# Patient Record
Sex: Male | Born: 1950 | Hispanic: No | Smoking: Former smoker
Health system: Southern US, Community
[De-identification: ages and names within clinical notes are randomized; demographics above are authoritative.]

---

## 2015-03-13 ENCOUNTER — Emergency Department (HOSPITAL_COMMUNITY)

## 2015-03-13 ENCOUNTER — Encounter (HOSPITAL_COMMUNITY): Payer: Self-pay | Admitting: Emergency Medicine

## 2015-03-13 ENCOUNTER — Encounter (HOSPITAL_COMMUNITY)
Admission: EM | Disposition: A | Discharge status: Home / Self Care | Payer: Self-pay | Source: Home / Self Care | Attending: Emergency Medicine

## 2015-03-13 ENCOUNTER — Emergency Department (HOSPITAL_COMMUNITY): Admitting: Anesthesiology

## 2015-03-13 ENCOUNTER — Emergency Department (HOSPITAL_COMMUNITY)
Admission: EM | Admit: 2015-03-13 | Discharge: 2015-03-14 | Disposition: A | Discharge status: Home / Self Care | Attending: Orthopedic Surgery | Admitting: Orthopedic Surgery

## 2015-03-13 DIAGNOSIS — W298XXA Contact with other powered powered hand tools and household machinery, initial encounter: Secondary | ICD-10-CM | POA: Insufficient documentation

## 2015-03-13 DIAGNOSIS — S66325A Laceration of extensor muscle, fascia and tendon of left ring finger at wrist and hand level, initial encounter: Secondary | ICD-10-CM | POA: Insufficient documentation

## 2015-03-13 DIAGNOSIS — Y9389 Activity, other specified: Secondary | ICD-10-CM | POA: Insufficient documentation

## 2015-03-13 DIAGNOSIS — S62325B Displaced fracture of shaft of fourth metacarpal bone, left hand, initial encounter for open fracture: Secondary | ICD-10-CM | POA: Insufficient documentation

## 2015-03-13 DIAGNOSIS — S66323A Laceration of extensor muscle, fascia and tendon of left middle finger at wrist and hand level, initial encounter: Secondary | ICD-10-CM | POA: Diagnosis not present

## 2015-03-13 DIAGNOSIS — Z23 Encounter for immunization: Secondary | ICD-10-CM | POA: Insufficient documentation

## 2015-03-13 DIAGNOSIS — F17210 Nicotine dependence, cigarettes, uncomplicated: Secondary | ICD-10-CM | POA: Diagnosis not present

## 2015-03-13 DIAGNOSIS — Y92009 Unspecified place in unspecified non-institutional (private) residence as the place of occurrence of the external cause: Secondary | ICD-10-CM | POA: Diagnosis not present

## 2015-03-13 DIAGNOSIS — S61412A Laceration without foreign body of left hand, initial encounter: Secondary | ICD-10-CM

## 2015-03-13 DIAGNOSIS — S62309B Unspecified fracture of unspecified metacarpal bone, initial encounter for open fracture: Secondary | ICD-10-CM

## 2015-03-13 DIAGNOSIS — W293XXA Contact with powered garden and outdoor hand tools and machinery, initial encounter: Secondary | ICD-10-CM | POA: Diagnosis not present

## 2015-03-13 DIAGNOSIS — Y999 Unspecified external cause status: Secondary | ICD-10-CM | POA: Diagnosis not present

## 2015-03-13 DIAGNOSIS — S62323B Displaced fracture of shaft of third metacarpal bone, left hand, initial encounter for open fracture: Secondary | ICD-10-CM | POA: Insufficient documentation

## 2015-03-13 DIAGNOSIS — S62301A Unspecified fracture of second metacarpal bone, left hand, initial encounter for closed fracture: Secondary | ICD-10-CM | POA: Diagnosis not present

## 2015-03-13 HISTORY — PX: I&D EXTREMITY: SHX5045

## 2015-03-13 HISTORY — PX: OPEN REDUCTION INTERNAL FIXATION (ORIF) HAND: SHX5991

## 2015-03-13 SURGERY — MINOR IRRIGATION AND DEBRIDEMENT EXTREMITY
Anesthesia: General | Site: Hand | Laterality: Left

## 2015-03-13 MED ORDER — DEXAMETHASONE SODIUM PHOSPHATE 4 MG/ML IJ SOLN
INTRAMUSCULAR | Status: DC | PRN
  Administered 2015-03-13: 4 mg via INTRAVENOUS

## 2015-03-13 MED ORDER — CEFAZOLIN SODIUM 1-5 GM-% IV SOLN: 2.0000 g | Freq: Once | INTRAVENOUS | Status: DC

## 2015-03-13 MED ORDER — CEFAZOLIN SODIUM-DEXTROSE 2-3 GM-% IV SOLR
INTRAVENOUS | Status: DC | PRN
Start: 2015-03-13 — End: 2015-03-14
  Administered 2015-03-13: 2 g via INTRAVENOUS

## 2015-03-13 MED ORDER — ONDANSETRON HCL 4 MG/2ML IJ SOLN
INTRAMUSCULAR | Status: AC
  Filled 2015-03-13: qty 2

## 2015-03-13 MED ORDER — SODIUM CHLORIDE 0.9 % IR SOLN
Status: DC | PRN
  Administered 2015-03-13: 1000 mL

## 2015-03-13 MED ORDER — BUPIVACAINE HCL (PF) 0.25 % IJ SOLN
INTRAMUSCULAR | Status: AC
  Filled 2015-03-13: qty 30

## 2015-03-13 MED ORDER — ONDANSETRON HCL 4 MG/2ML IJ SOLN
4.0000 mg | Freq: Once | INTRAMUSCULAR | Status: AC
  Administered 2015-03-13: 4 mg via INTRAVENOUS
  Filled 2015-03-13: qty 2

## 2015-03-13 MED ORDER — CEFAZOLIN SODIUM-DEXTROSE 2-3 GM-% IV SOLR
2.0000 g | Freq: Once | INTRAVENOUS | Status: AC
  Administered 2015-03-13: 2 g via INTRAVENOUS
  Filled 2015-03-13: qty 50

## 2015-03-13 MED ORDER — MORPHINE SULFATE (PF) 4 MG/ML IV SOLN
4.0000 mg | Freq: Once | INTRAVENOUS | Status: AC
Start: 2015-03-13 — End: 2015-03-13
  Administered 2015-03-13: 4 mg via INTRAVENOUS
  Filled 2015-03-13: qty 1

## 2015-03-13 MED ORDER — SUFENTANIL CITRATE 50 MCG/ML IV SOLN
INTRAVENOUS | Status: AC
  Filled 2015-03-13: qty 1

## 2015-03-13 MED ORDER — MIDAZOLAM HCL 5 MG/5ML IJ SOLN
INTRAMUSCULAR | Status: DC | PRN
  Administered 2015-03-13: 2 mg via INTRAVENOUS

## 2015-03-13 MED ORDER — PROPOFOL 10 MG/ML IV BOLUS
INTRAVENOUS | Status: DC | PRN
  Administered 2015-03-13: 150 mg via INTRAVENOUS

## 2015-03-13 MED ORDER — STERILE WATER FOR INJECTION IJ SOLN
INTRAMUSCULAR | Status: AC
  Filled 2015-03-13: qty 20

## 2015-03-13 MED ORDER — ONDANSETRON HCL 4 MG/2ML IJ SOLN
4.0000 mg | Freq: Once | INTRAMUSCULAR | Status: DC
  Filled 2015-03-13: qty 2

## 2015-03-13 MED ORDER — LIDOCAINE HCL (CARDIAC) 20 MG/ML IV SOLN
INTRAVENOUS | Status: AC
  Filled 2015-03-13: qty 5

## 2015-03-13 MED ORDER — PROPOFOL 10 MG/ML IV BOLUS
INTRAVENOUS | Status: AC
  Filled 2015-03-13: qty 20

## 2015-03-13 MED ORDER — LACTATED RINGERS IV SOLN
INTRAVENOUS | Status: DC | PRN
  Administered 2015-03-13: 23:00:00 via INTRAVENOUS

## 2015-03-13 MED ORDER — CEFAZOLIN SODIUM 1-5 GM-% IV SOLN
INTRAVENOUS | Status: AC
  Filled 2015-03-13: qty 50

## 2015-03-13 MED ORDER — CEFAZOLIN SODIUM 1 G IJ SOLR
2.0000 g | Freq: Once | INTRAMUSCULAR | Status: DC
  Filled 2015-03-13: qty 20

## 2015-03-13 MED ORDER — MORPHINE SULFATE (PF) 4 MG/ML IV SOLN
4.0000 mg | Freq: Once | INTRAVENOUS | Status: DC
  Filled 2015-03-13: qty 1

## 2015-03-13 MED ORDER — MORPHINE SULFATE (PF) 4 MG/ML IV SOLN
4.0000 mg | Freq: Once | INTRAVENOUS | Status: AC
  Administered 2015-03-13: 4 mg via INTRAVENOUS
  Filled 2015-03-13: qty 1

## 2015-03-13 MED ORDER — LIDOCAINE HCL (PF) 1 % IJ SOLN
INTRAMUSCULAR | Status: AC
  Filled 2015-03-13: qty 5

## 2015-03-13 MED ORDER — CEFAZOLIN SODIUM-DEXTROSE 2-3 GM-% IV SOLR
INTRAVENOUS | Status: AC
  Filled 2015-03-13: qty 50

## 2015-03-13 MED ORDER — TETANUS-DIPHTH-ACELL PERTUSSIS 5-2.5-18.5 LF-MCG/0.5 IM SUSP
0.5000 mL | Freq: Once | INTRAMUSCULAR | Status: AC
  Administered 2015-03-13: 0.5 mL via INTRAMUSCULAR
  Filled 2015-03-13: qty 0.5

## 2015-03-13 MED ORDER — OXYCODONE-ACETAMINOPHEN 5-325 MG PO TABS
1.0000 | ORAL_TABLET | Freq: Once | ORAL | Status: DC
Start: 2015-03-13 — End: 2015-03-13

## 2015-03-13 MED ORDER — MIDAZOLAM HCL 2 MG/2ML IJ SOLN
INTRAMUSCULAR | Status: AC
  Filled 2015-03-13: qty 4

## 2015-03-13 MED ORDER — BACITRACIN ZINC 500 UNIT/GM EX OINT
TOPICAL_OINTMENT | CUTANEOUS | Status: AC
  Filled 2015-03-13: qty 28.35

## 2015-03-13 MED ORDER — LIDOCAINE HCL (CARDIAC) 20 MG/ML IV SOLN
INTRAVENOUS | Status: DC | PRN
  Administered 2015-03-13: 60 mg via INTRAVENOUS

## 2015-03-13 MED ORDER — DEXAMETHASONE SODIUM PHOSPHATE 4 MG/ML IJ SOLN
INTRAMUSCULAR | Status: AC
  Filled 2015-03-13: qty 1

## 2015-03-13 MED ORDER — SUFENTANIL CITRATE 50 MCG/ML IV SOLN
INTRAVENOUS | Status: DC | PRN
  Administered 2015-03-13 (×2): 5 ug via INTRAVENOUS

## 2015-03-13 SURGICAL SUPPLY — 80 items
BANDAGE COBAN STERILE 2 (GAUZE/BANDAGES/DRESSINGS) IMPLANT
BANDAGE ELASTIC 3 VELCRO ST LF (GAUZE/BANDAGES/DRESSINGS) ×4 IMPLANT
BANDAGE ELASTIC 4 VELCRO ST LF (GAUZE/BANDAGES/DRESSINGS) ×4 IMPLANT
BLADE MINI RND TIP GREEN BEAV (BLADE) IMPLANT
BNDG COHESIVE 1X5 TAN STRL LF (GAUZE/BANDAGES/DRESSINGS) IMPLANT
BNDG COHESIVE 3X5 TAN STRL LF (GAUZE/BANDAGES/DRESSINGS) IMPLANT
BNDG CONFORM 2 STRL LF (GAUZE/BANDAGES/DRESSINGS) IMPLANT
BNDG ESMARK 4X9 LF (GAUZE/BANDAGES/DRESSINGS) ×4 IMPLANT
BNDG GAUZE ELAST 4 BULKY (GAUZE/BANDAGES/DRESSINGS) ×4 IMPLANT
CORDS BIPOLAR (ELECTRODE) ×4 IMPLANT
COVER SURGICAL LIGHT HANDLE (MISCELLANEOUS) ×4 IMPLANT
CUFF TOURNIQUET SINGLE 18IN (TOURNIQUET CUFF) ×4 IMPLANT
CUFF TOURNIQUET SINGLE 24IN (TOURNIQUET CUFF) IMPLANT
DECANTER SPIKE VIAL GLASS SM (MISCELLANEOUS) ×4 IMPLANT
DRAIN PENROSE 1/4X12 LTX STRL (WOUND CARE) IMPLANT
DRAPE OEC MINIVIEW 54X84 (DRAPES) IMPLANT
DRSG ADAPTIC 3X8 NADH LF (GAUZE/BANDAGES/DRESSINGS) ×4 IMPLANT
DRSG EMULSION OIL 3X3 NADH (GAUZE/BANDAGES/DRESSINGS) ×4 IMPLANT
DRSG KUZMA FLUFF (GAUZE/BANDAGES/DRESSINGS) IMPLANT
DRSG PAD ABDOMINAL 8X10 ST (GAUZE/BANDAGES/DRESSINGS) ×8 IMPLANT
DURAPREP 26ML APPLICATOR (WOUND CARE) IMPLANT
GAUZE SPONGE 2X2 8PLY STRL LF (GAUZE/BANDAGES/DRESSINGS) IMPLANT
GAUZE SPONGE 4X4 12PLY STRL (GAUZE/BANDAGES/DRESSINGS) ×4 IMPLANT
GAUZE XEROFORM 1X8 LF (GAUZE/BANDAGES/DRESSINGS) ×4 IMPLANT
GAUZE XEROFORM 5X9 LF (GAUZE/BANDAGES/DRESSINGS) ×4 IMPLANT
GLOVE BIO SURGEON STRL SZ7 (GLOVE) ×8 IMPLANT
GLOVE BIO SURGEON STRL SZ7.5 (GLOVE) ×4 IMPLANT
GLOVE BIOGEL PI IND STRL 7.0 (GLOVE) ×4 IMPLANT
GLOVE BIOGEL PI IND STRL 8 (GLOVE) ×2 IMPLANT
GLOVE BIOGEL PI INDICATOR 7.0 (GLOVE) ×4
GLOVE BIOGEL PI INDICATOR 8 (GLOVE) ×2
GOWN STRL REUS W/ TWL LRG LVL3 (GOWN DISPOSABLE) ×6 IMPLANT
GOWN STRL REUS W/ TWL XL LVL3 (GOWN DISPOSABLE) ×2 IMPLANT
GOWN STRL REUS W/TWL LRG LVL3 (GOWN DISPOSABLE) ×6
GOWN STRL REUS W/TWL XL LVL3 (GOWN DISPOSABLE) ×2
HANDPIECE INTERPULSE COAX TIP (DISPOSABLE)
K-WIRE DBL 4X.28 (WIRE) ×16
KIT BASIN OR (CUSTOM PROCEDURE TRAY) ×4 IMPLANT
KIT ROOM TURNOVER OR (KITS) ×4 IMPLANT
KWIRE DBL 4X.28 (WIRE) ×8 IMPLANT
LOOP VESSEL MAXI BLUE (MISCELLANEOUS) ×4 IMPLANT
LOOP VESSEL MINI RED (MISCELLANEOUS) IMPLANT
MANIFOLD NEPTUNE II (INSTRUMENTS) ×4 IMPLANT
NEEDLE HYPO 25GX1X1/2 BEV (NEEDLE) ×4 IMPLANT
NEEDLE HYPO 25X1 1.5 SAFETY (NEEDLE) IMPLANT
NS IRRIG 1000ML POUR BTL (IV SOLUTION) ×4 IMPLANT
PACK ORTHO EXTREMITY (CUSTOM PROCEDURE TRAY) ×4 IMPLANT
PAD ARMBOARD 7.5X6 YLW CONV (MISCELLANEOUS) ×8 IMPLANT
PAD CAST 4YDX4 CTTN HI CHSV (CAST SUPPLIES) IMPLANT
PADDING CAST ABS 4INX4YD NS (CAST SUPPLIES) ×2
PADDING CAST ABS COTTON 4X4 ST (CAST SUPPLIES) ×2 IMPLANT
PADDING CAST COTTON 4X4 STRL (CAST SUPPLIES)
RUBBERBAND STERILE (MISCELLANEOUS) ×4 IMPLANT
SCRUB BETADINE 4OZ XXX (MISCELLANEOUS) ×4 IMPLANT
SET CYSTO W/LG BORE CLAMP LF (SET/KITS/TRAYS/PACK) ×4 IMPLANT
SET HNDPC FAN SPRY TIP SCT (DISPOSABLE) IMPLANT
SOLUTION BETADINE 4OZ (MISCELLANEOUS) ×4 IMPLANT
SPECIMEN JAR SMALL (MISCELLANEOUS) ×4 IMPLANT
SPLINT FIBERGLASS 3X12 (CAST SUPPLIES) ×4 IMPLANT
SPONGE GAUZE 2X2 STER 10/PKG (GAUZE/BANDAGES/DRESSINGS)
SPONGE GAUZE 4X4 12PLY STER LF (GAUZE/BANDAGES/DRESSINGS) ×4 IMPLANT
SPONGE LAP 18X18 X RAY DECT (DISPOSABLE) ×4 IMPLANT
SPONGE LAP 4X18 X RAY DECT (DISPOSABLE) ×4 IMPLANT
SPONGE SCRUB IODOPHOR (GAUZE/BANDAGES/DRESSINGS) ×4 IMPLANT
SUCTION FRAZIER TIP 10 FR DISP (SUCTIONS) ×4 IMPLANT
SUT ETHILON 4 0 PS 2 18 (SUTURE) ×8 IMPLANT
SUT ETHILON 5 0 PS 2 18 (SUTURE) IMPLANT
SUT MON AB 5-0 P3 18 (SUTURE) IMPLANT
SUT SILK 4 0 PS 2 (SUTURE) IMPLANT
SUT VICRYL 4-0 PS2 18IN ABS (SUTURE) ×12 IMPLANT
SYR CONTROL 10ML LL (SYRINGE) IMPLANT
TOWEL OR 17X24 6PK STRL BLUE (TOWEL DISPOSABLE) ×4 IMPLANT
TOWEL OR 17X26 10 PK STRL BLUE (TOWEL DISPOSABLE) ×4 IMPLANT
TUBE ANAEROBIC SPECIMEN COL (MISCELLANEOUS) IMPLANT
TUBE CONNECTING 12'X1/4 (SUCTIONS) ×1
TUBE CONNECTING 12X1/4 (SUCTIONS) ×3 IMPLANT
TUBE FEEDING 5FR 15 INCH (TUBING) IMPLANT
UNDERPAD 30X30 INCONTINENT (UNDERPADS AND DIAPERS) ×4 IMPLANT
WATER STERILE IRR 1000ML POUR (IV SOLUTION) ×4 IMPLANT
YANKAUER SUCT BULB TIP NO VENT (SUCTIONS) ×4 IMPLANT

## 2015-03-13 NOTE — ED Notes (Signed)
Pt states that he cut hit hand with an angle grinder.  States blade came apart and sliced left hand.

## 2015-03-13 NOTE — H&P (Signed)
  Adam Gentry is an 64 y.o. male.   Chief Complaint: left hand grinder injury HPI: 64 yo male states he was using a grinder earlier today when the wheel broke and lacerated his left hand.  Seen at [facility] where XR revealed metacarpal fractures.  Transferred to [facility] for further care.  Reports no other injury at this time.  History reviewed. No pertinent past medical history.  History reviewed. No pertinent past surgical history.  History reviewed. No pertinent family history. Social History:  reports that he has been smoking Cigarettes.  He does not have any smokeless tobacco history on file. He reports that he drinks alcohol. He reports that he does not use illicit drugs.  Allergies: No Known Allergies   (Not in a hospital admission)  No results found for this or any previous visit (from the past 48 hour(s)).  Dg Hand Complete Left  03/13/2015   CLINICAL DATA:  Status post laceration of the left hand when a grinder broke apart today. Initial encounter.  EXAM: LEFT HAND - COMPLETE 3+ VIEW  COMPARISON:  None.  FINDINGS: A laceration is seen over the dorsum of the hand centered over the third, fourth and fifth MCP joints. There is a large amount of radiopaque debris within the laceration in the webspace between the long and ring and ring and little fingers and at the level of the distal second and third metacarpals.  The patient has a nondisplaced fracture of the ulnar margin of the head of the third metacarpal. It does not appear to reach the articular surface. There is also a fracture of the fourth metacarpal which is oblique in orientation extending from the mid diaphysis on the ulnar side through the metaphysis on the radial side. The fracture appears to have a nondisplaced component extending to the articular surface of the fourth metacarpal centrally.  A small piece of radiopaque debris is seen in the little finger and is likely chronic. Radiocarpal degenerative disease is noted.  IMPRESSION:  Laceration of the dorsum of the hand with radiopaque debris and fractures of the third and fourth metacarpals.   Electronically Signed   By: Drusilla Kanner M.D.   On: 03/13/2015 15:24     A comprehensive review of systems was negative.  Blood pressure 159/92, pulse 83, temperature 98.5 F (36.9 C), temperature source Oral, resp. rate 16, height 6' (1.829 m), weight 77.111 kg (170 lb), SpO2 98 %.  General appearance: alert, cooperative and appears stated age Head: Normocephalic, without obvious abnormality, atraumatic Neck: supple, symmetrical, trachea midline Resp: clear to auscultation bilaterally Cardio: regular rate and rhythm GI: non tender Extremities: intact senation and capillary refill all digits.  +epl/fpl/io.  weak extension of long and ring fingers.  laceration dorsal hand over mp of long and ring. Pulses: 2+ and symmetric Skin: Skin color, texture, turgor normal. No rashes or lesions Neurologic: Grossly normal Incision/Wound: As above  Assessment/Plan Left hand long and ring open metacarpal fractures and extensor tendon lacerations.  Recommend OR for irrigation and debridement and fixation of fractures and repair tendon.  Risks, benefits, and alternatives of surgery were discussed and the patient agrees with the plan of care.   KUZMA,KEVIN R 03/13/2015, 10:45 PM

## 2015-03-13 NOTE — ED Notes (Signed)
Report to Chad, Charity fundraiser Engineer, [employer] systems) at [facility] ED

## 2015-03-13 NOTE — ED Notes (Signed)
Report given to [facility] Elita Quick, RN) at this time. ETA .

## 2015-03-13 NOTE — Anesthesia Preprocedure Evaluation (Addendum)
Anesthesia Evaluation  Patient identified by MRN, date of birth, ID band Patient awake    Reviewed: Allergy & Precautions, NPO status , Patient's Chart, lab work & pertinent test results  Airway Mallampati: II  TM Distance: >3 FB Neck ROM: Full    Dental   Pulmonary Current Smoker,    breath sounds clear to auscultation       Cardiovascular negative cardio ROS   Rhythm:Regular Rate:Normal     Neuro/Psych negative neurological ROS     GI/Hepatic negative GI ROS, Neg liver ROS,   Endo/Other  negative endocrine ROS  Renal/GU negative Renal ROS     Musculoskeletal   Abdominal   Peds  Hematology negative hematology ROS (+)   Anesthesia Other Findings   Reproductive/Obstetrics                           Anesthesia Physical Anesthesia Plan  ASA: II  Anesthesia Plan: General   Post-op Pain Management:    Induction: Intravenous  Airway Management Planned: LMA  Additional Equipment:   Intra-op Plan:   Post-operative Plan:   Informed Consent: I have reviewed the patients History and Physical, chart, labs and discussed the procedure including the risks, benefits and alternatives for the proposed anesthesia with the patient or authorized representative who has indicated his/her understanding and acceptance.     Plan Discussed with: CRNA  Anesthesia Plan Comments:        Anesthesia Quick Evaluation

## 2015-03-13 NOTE — Anesthesia Procedure Notes (Signed)
Procedure Name: LMA Insertion Date/Time: 03/13/2015 11:07 PM Performed by: Melina Schools Pre-anesthesia Checklist: Patient identified, Emergency Drugs available, Suction available, Patient being monitored and Timeout performed Patient Re-evaluated:Patient Re-evaluated prior to inductionOxygen Delivery Method: Circle system utilized Preoxygenation: Pre-oxygenation with 100% oxygen Intubation Type: IV induction LMA: LMA inserted LMA Size: 4.0 Number of attempts: 1 Placement Confirmation: positive ETCO2 and breath sounds checked- equal and bilateral Tube secured with: Tape Dental Injury: Teeth and Oropharynx as per pre-operative assessment

## 2015-03-13 NOTE — ED Provider Notes (Signed)
CSN: [id number]     Arrival date & time 03/13/15  1417 History   First MD Initiated Contact with Patient 03/13/15 1455     Chief Complaint  Patient presents with  . Hand Injury    left     (Consider location/radiation/quality/duration/timing/severity/associated sxs/prior Treatment) HPI Comments: 64 year old right-handed male who presents with left hand laceration. Just prior to arrival, the patient was using an angle grinder when the blade came apart and sliced the dorsum of his left hand. It bumped his left thigh but was blocked by his cell phone and he did not sustain any other injuries. He endorses normal sensation of his fingers and feels like he can move his fingers normally. He endorses severe, constant pain in his hand, worse with movement. Unknown last tetanus vaccination.  Patient is a 64 y.o. male presenting with hand injury. The history is provided by the patient.  Hand Injury   History reviewed. No pertinent past medical history. History reviewed. No pertinent past surgical history. History reviewed. No pertinent family history. Social History  Substance Use Topics  . Smoking status: Current Some Day Smoker    Types: Cigarettes  . Smokeless tobacco: None  . Alcohol Use: Yes     Comment: occasional    Review of Systems  10 Systems reviewed and are negative for acute change except as noted in the HPI.   Allergies  Review of patient's allergies indicates no known allergies.  Home Medications   Prior to Admission medications   Not on File   BP 120/77 mmHg  Pulse 68  Temp(Src) 97.7 F (36.5 C) (Oral)  Resp 18  Ht 6' (1.829 m)  Wt 170 lb (77.111 kg)  BMI 23.05 kg/m2  SpO2 96% Physical Exam  Constitutional: He is oriented to person, place, and time. He appears well-developed and well-nourished. No distress.  HENT:  Head: Normocephalic and atraumatic.  Eyes: Conjunctivae are normal. Pupils are equal, round, and reactive to light.  Neck: Neck supple.    Cardiovascular: Normal rate, regular rhythm and normal heart sounds.   No murmur heard. Pulmonary/Chest: Effort normal and breath sounds normal.  Abdominal: Soft. Bowel sounds are normal. He exhibits no distension. There is no tenderness.  Musculoskeletal:  Large laceration involving dorsum of L hand; 2+ radial pulses; normal cap refill L fingers, normal strength of flexion/extension in all 4 fingers of L hand  Neurological: He is alert and oriented to person, place, and time.  Fluent speech  Skin: Skin is warm and dry.     Psychiatric: He has a normal mood and affect. Judgment normal.  Nursing note and vitals reviewed.   ED Course  Procedures (including critical care time) Labs Review Labs Reviewed - No data to display  Imaging Review Dg Hand Complete Left  03/13/2015   CLINICAL DATA:  Status post laceration of the left hand when a grinder broke apart today. Initial encounter.  EXAM: LEFT HAND - COMPLETE 3+ VIEW  COMPARISON:  None.  FINDINGS: A laceration is seen over the dorsum of the hand centered over the third, fourth and fifth MCP joints. There is a large amount of radiopaque debris within the laceration in the webspace between the long and ring and ring and little fingers and at the level of the distal second and third metacarpals.  The patient has a nondisplaced fracture of the ulnar margin of the head of the third metacarpal. It does not appear to reach the articular surface. There is also a fracture of  the fourth metacarpal which is oblique in orientation extending from the mid diaphysis on the ulnar side through the metaphysis on the radial side. The fracture appears to have a nondisplaced component extending to the articular surface of the fourth metacarpal centrally.  A small piece of radiopaque debris is seen in the little finger and is likely chronic. Radiocarpal degenerative disease is noted.  IMPRESSION: Laceration of the dorsum of the hand with radiopaque debris and  fractures of the third and fourth metacarpals.   Electronically Signed   By: Drusilla Kanner M.D.   On: 03/13/2015 15:24   I have personally reviewed and evaluated these images as part of my medical decision-making.   EKG Interpretation None      Medications  Tdap (BOOSTRIX) injection 0.5 mL (0.5 mLs Intramuscular Given 03/13/15 1541)  morphine 4 MG/ML injection 4 mg (4 mg Intravenous Given 03/13/15 1533)  ondansetron (ZOFRAN) injection 4 mg (4 mg Intravenous Given 03/13/15 1612)  ceFAZolin (ANCEF) IVPB 2 g/50 mL premix (2 g Intravenous New Bag/Given 03/13/15 1612)    MDM   Final diagnoses:  Hand laceration, left, initial encounter  Multiple fractures of metacarpal bone, open, initial encounter   64 year old male who presents with a left hand laceration caused by a grinder that struck his left hand earlier today. Patient uncomfortable but in no acute distress at presentation. Fingers were neurovascularly intact and he had normal strength of flexion and extension on all 4 fingers of his left hand. No arterial bleeding. Obtained plain film which shows fractures of the shaft of the fourth metacarpal and distal metaphysis and epiphysis of the third metacarpal which appears to extend into the joint space. There is also punctate debris on x-ray. Because these fractures are open, gave the patient 2 g Ancef and updated tetanus. Also gave morphine and Zofran. Last PO was 11am. I have spoken with the hand surgeon on call, Dr. Naaman Plummer, and he has requested transfer to [facility] ED for evaluation and management. I have spoken with Dr. Madilyn Hook in [facility] ED who will notify charge nurse of patient's transfer. I discussed risks and benefits of personal vehicle vs EMS transport and patient has requested ambulance transport. Pt in agreement with plan and transferred in stable condition.  Laurence Spates, MD 03/13/15 [phone]

## 2015-03-14 ENCOUNTER — Encounter (HOSPITAL_COMMUNITY): Payer: Self-pay | Admitting: Orthopedic Surgery

## 2015-03-14 MED ORDER — OXYCODONE HCL 5 MG/5ML PO SOLN: 5.0000 mg | Freq: Once | ORAL | Status: DC | PRN

## 2015-03-14 MED ORDER — ONDANSETRON HCL 4 MG/2ML IJ SOLN
INTRAMUSCULAR | Status: DC | PRN
  Administered 2015-03-14: 4 mg via INTRAVENOUS

## 2015-03-14 MED ORDER — PROMETHAZINE HCL 25 MG/ML IJ SOLN: 6.2500 mg | INTRAMUSCULAR | Status: DC | PRN

## 2015-03-14 MED ORDER — OXYCODONE-ACETAMINOPHEN 5-325 MG PO TABS: ORAL_TABLET | ORAL | Status: AC

## 2015-03-14 MED ORDER — BUPIVACAINE HCL (PF) 0.25 % IJ SOLN
INTRAMUSCULAR | Status: DC | PRN
  Administered 2015-03-14: 10 mL

## 2015-03-14 MED ORDER — SULFAMETHOXAZOLE-TRIMETHOPRIM 800-160 MG PO TABS: 1.0000 | ORAL_TABLET | Freq: Two times a day (BID) | ORAL | Status: AC

## 2015-03-14 MED ORDER — OXYCODONE HCL 5 MG PO TABS: 5.0000 mg | ORAL_TABLET | Freq: Once | ORAL | Status: DC | PRN

## 2015-03-14 MED ORDER — HYDROMORPHONE HCL 1 MG/ML IJ SOLN: 0.2500 mg | INTRAMUSCULAR | Status: DC | PRN

## 2015-03-14 NOTE — Brief Op Note (Signed)
03/13/2015 - 03/14/2015  12:42 AM  PATIENT:  Adam Gentry  64 y.o. male  PRE-OPERATIVE DIAGNOSIS:  left hand fracture  POST-OPERATIVE DIAGNOSIS:  Left hand fracture  PROCEDURE:  Procedure(s): MINOR IRRIGATION AND DEBRIDEMENT EXTREMITY (Left) OPEN REDUCTION INTERNAL FIXATION (ORIF) HAND (Left)  SURGEON:  Surgeon(s) and Role:    * Betha Loa, MD - Primary  PHYSICIAN ASSISTANT:   ASSISTANTS: none   ANESTHESIA:   general  EBL:     BLOOD ADMINISTERED:none  DRAINS: vessel loop drains  LOCAL MEDICATIONS USED:  MARCAINE     SPECIMEN:  No Specimen  DISPOSITION OF SPECIMEN:  N/A  COUNTS:  YES  TOURNIQUET:   Total Tourniquet Time Documented: Upper Arm (Left) - 69 minutes Total: Upper Arm (Left) - 69 minutes   DICTATION: .Other Dictation: Dictation Number [id number]  PLAN OF CARE: Discharge to home after PACU  PATIENT DISPOSITION:  PACU - hemodynamically stable.

## 2015-03-14 NOTE — Discharge Instructions (Signed)

## 2015-03-14 NOTE — Op Note (Signed)
[id number] 

## 2015-03-14 NOTE — Transfer of Care (Signed)
Immediate Anesthesia Transfer of Care Note  Patient: Adam Gentry  Procedure(s) Performed: Procedure(s): MINOR IRRIGATION AND DEBRIDEMENT EXTREMITY (Left) OPEN REDUCTION INTERNAL FIXATION (ORIF) HAND (Left)  Patient Location: PACU  Anesthesia Type:General  Level of Consciousness: oriented, sedated, patient cooperative and responds to stimulation  Airway & Oxygen Therapy: Patient Spontanous Breathing and Patient connected to nasal cannula oxygen  Post-op Assessment: Report given to RN, Post -op Vital signs reviewed and stable and Patient moving all extremities X 4  Post vital signs: Reviewed and stable  Last Vitals:  Filed Vitals:   03/13/15 2218  BP: 159/92  Pulse: 83  Temp: 36.9 C  Resp: 16    Complications: No apparent anesthesia complications

## 2015-03-14 NOTE — Op Note (Signed)
NAME:  Adam Gentry, Adam Gentry NO.:  [id number]  MEDICAL RECORD NO.:  [id number]  LOCATION:  [facility]                         FACILITY:  [facility]  PHYSICIAN:  Betha Loa, MD        DATE OF BIRTH:  Jul 09, 1950  DATE OF PROCEDURE:  03/14/2015 DATE OF DISCHARGE:  03/14/2015                              OPERATIVE REPORT   PREOPERATIVE DIAGNOSIS:  Left long and ring finger open metacarpal fractures and extensor tendon laceration.  POSTOPERATIVE DIAGNOSIS:  Left long and ring finger open metacarpal fractures.  PROCEDURE:   1. Irrigation and debridement of left long finger open metacarpal fracture 2. Irrigation and debridement of left ring finger open metacarpal fracture 3. Open reduction and pinning of left long finger metacarpal fracture. 4. Open reduction and pinning of left ring finger metacarpal fracture.  SURGEON:  Betha Loa, M.D.  ASSISTANT:  None.  ANESTHESIA:  General.  IV FLUIDS:  Per Anesthesia flow sheet.  ESTIMATED BLOOD LOSS:  Minimal.  COMPLICATIONS:  None.  SPECIMENS:  None.  TOURNIQUET TIME:  69 minutes.  DISPOSITION:  Stable to PACU.  INDICATIONS:  Adam Gentry is a 64 year old male who states he was using a grinder today when the wheel broke, lacerating his left hand.  He was seen at the [facility] Emergency Department where radiographs were taken, revealing metacarpal fractures.  He was transferred to [facility] for further care.  I recommended operative irrigation and debridement of open fractures with fixation of fracture and repair of extensor tendon laceration present.  Risks, benefits, and alternatives of surgery were discussed including the risk of blood loss; infection; damage to nerves, vessels, tendons, ligaments, bone; failure of surgery; need for additional surgery; complications with wound healing; continued pain; nonunion; malunion; stiffness.  He voiced understanding of these risks and elected to proceed.  DESCRIPTION OF PROCEDURE:   After being identified preoperatively by myself, the patient and I agreed upon the procedure and site of the procedure.  Surgical site was marked.  The risks, benefits, and alternatives of surgery were reviewed and he wished to proceed. Surgical consent had been signed.  His tetanus had been updated and he had been given Ancef in the emergency department.  His Ancef was redosed prior to going to the OR.  He was transferred to the operating room, placed on the operating room table in supine position with left upper extremity on the arm board.  General anesthesia was induced by anesthesiologist.  Left upper extremity was prepped and draped in normal sterile orthopedic fashion.  Surgical pause was performed between surgeons, Anesthesia, and operating staff, and all were in agreement as to the patient, procedure, site of the procedure.  Tourniquet at the proximal aspect of the extremity was inflated to 250 mmHg after exsanguination of the limb with Esmarch bandage.  The skin flap was retracted back.  The extensor tendons were abraded, but were intact. The fractures of the long and ring finger metacarpal head and neck were exposed through rents in the tissue.  There was gross contamination with a gritty substance, likely the bits of grinder wheel.  This was debrided.  The wound and fractures were copiously irrigated with sterile saline by bulb  syringe.  Once adequate debridement had been obtained, the fractures were stabilized using 0.045 inch K-wires.  Two K-wires were used in each metacarpal in a crossed fashion.  This provided good stabilization of the long and ring finger metacarpal fractures.  The skin flap was then repaired back down using 4-0 nylon suture in an interrupted and horizontal mattress fashion.  This provided acceptable apposition of the soft tissues.  The area was injected with 10 mL of 0.25% plain Marcaine to aid in postoperative analgesia.  The C-arm was used in AP,  lateral, and oblique projections throughout the case to aid in reduction and positioning of hardware.  The pins were bent and cut short.  The wound and pin sites were dressed with sterile Xeroform, 4x4s, and wrapped with a Kerlix bandage.  A volar and dorsal slab splint including the index, long, ring, and small fingers was placed with the MPs flexed and IPs extended.  This was wrapped with Kerlix and Ace bandage.  Tourniquet was deflated at 69 minutes.  The fingertips were pink with brisk capillary refill after deflation of the tourniquet.  The operative drapes were broken down.  The patient was awoken from anesthesia safely.  He was transferred back to the stretcher and taken to the PACU in stable condition.  I will see him back in the office in approximately 4 days for postoperative followup.  I will give him Percocet 5/325, 1 to 2 p.o. q.6 hours p.r.n. pain, dispensed #40, and Bactrim DS 1 p.o. b.i.d. x7 days.     Betha Loa, MD     KK/MEDQ  D:  03/14/2015  T:  03/14/2015  Job:  [id number]

## 2015-03-14 NOTE — Anesthesia Postprocedure Evaluation (Signed)
  Anesthesia Post-op Note  Patient: Adam Gentry  Procedure(s) Performed: Procedure(s): MINOR IRRIGATION AND DEBRIDEMENT EXTREMITY (Left) OPEN REDUCTION INTERNAL FIXATION (ORIF) HAND (Left)  Patient Location: PACU  Anesthesia Type:General  Level of Consciousness: awake and alert   Airway and Oxygen Therapy: Patient Spontanous Breathing  Post-op Pain: mild  Post-op Assessment: Post-op Vital signs reviewed              Post-op Vital Signs: Reviewed  Last Vitals:  Filed Vitals:   03/14/15 0130  BP: 142/79  Pulse: 78  Temp: 36.7 C  Resp: 18    Complications: No apparent anesthesia complications

## 2015-03-14 NOTE — Op Note (Signed)
Intra-operative fluoroscopic images in the AP, lateral, and oblique views were taken and evaluated by myself.  Reduction and hardware placement were confirmed.  There was no intraarticular penetration of permanent hardware.  

## 2015-03-25 ENCOUNTER — Encounter: Payer: Self-pay | Admitting: Occupational Therapy

## 2015-03-25 ENCOUNTER — Ambulatory Visit: Attending: Orthopedic Surgery | Admitting: Occupational Therapy

## 2015-03-25 DIAGNOSIS — X58XXXA Exposure to other specified factors, initial encounter: Secondary | ICD-10-CM | POA: Insufficient documentation

## 2015-03-25 DIAGNOSIS — S6292XB Unspecified fracture of left wrist and hand, initial encounter for open fracture: Secondary | ICD-10-CM | POA: Diagnosis present

## 2015-03-25 NOTE — Therapy (Signed)
Adam Gentry [facility] [address] [place], [place], [address] Phone: [phone]   Fax:  [phone]  Occupational Therapy Evaluation  Patient Details  Name: Adam Gentry MRN: [id number] Date of Birth: [birth year] Referring Provider:  Betha Loa, MD  Encounter Date: 03/25/2015      OT End of Session - 03/25/15 0902    Visit Number 1   OT Start Time 0800   OT Stop Time 0848   OT Time Calculation (min) 48 min   Activity Tolerance Patient tolerated treatment well;No increased pain   Behavior During Therapy [facility] for tasks assessed/performed      History reviewed. No pertinent past medical history.  Past Surgical History  Procedure Laterality Date  . I&d extremity Left 03/13/2015    Procedure: MINOR IRRIGATION AND DEBRIDEMENT EXTREMITY;  Surgeon: Betha Loa, MD;  Location: [facility] OR;  Service: Orthopedics;  Laterality: Left;  . Open reduction internal fixation (orif) hand Left 03/13/2015    Procedure: OPEN REDUCTION INTERNAL FIXATION (ORIF) HAND;  Surgeon: Betha Loa, MD;  Location: [facility] OR;  Service: Orthopedics;  Laterality: Left;    There were no vitals filed for this visit.  Visit Diagnosis:  Left hand fracture, open, initial encounter - Plan: Ot plan of care cert/re-cert      Subjective Assessment - 03/25/15 0803    Subjective  Pt reports that he was cutting metal in half with a side grinder and the blade exploded and resulting in left hand LF & RF metacarpal open fractures. He underwent surgery on 03/14/15.   Pertinent History Pt reports non-significant PMH   Patient Stated Goals Get left hand use back.   Currently in Pain? Yes   Pain Score 1    Pain Location Hand   Pain Orientation Left   Pain Descriptors / Indicators Throbbing   Pain Type Acute pain   Pain Frequency Constant           [facility] OT Assessment - 03/25/15 0001    Assessment   Diagnosis s/p LF/RF [facility] fx's open reduction with pinning x4 & I&D Left  long and ring fingers. Per operative report, pt had "abraided extensor tendons but they were intact."    Onset Date 03/14/15  surgery date   Assessment Pt arrived fully wrapped and protected from surgery dressing/soft cast   Prior Therapy none   Precautions   Precautions Other (comment)   Precaution Comments splint on at all times (except for hygiene care), no movement of Lt hand, no gripping, pulling, lifting with Lt hand   Required Braces or Orthoses Other Brace/Splint   Other Brace/Splint static splint with wrist neutral, MP's flexed to comfort, and IP's extended per MD orders.   Balance Screen   Has the patient fallen in the past 6 months No   Has the patient had a decrease in activity level because of a fear of falling?  No   Is the patient reluctant to leave their home because of a fear of falling?  No   Home  Environment   Lives With Son   Prior Function   Level of Independence Independent   Vocation Retired   ADL   ADL comments Mod I for BADLS, but son can assist prn   Written Expression   Dominant Hand Right   Edema   Edema dorsally at MP level and fingers, moderate                  OT Treatments/Exercises (OP) -  03/25/15 0001    ADLs   Overall ADLs Pt was I ADL's prior to this injury/surgery   Eating Mod I   Grooming Mod I   Functional Mobility Independent ambulation   ADL Education Given --  No functional use of left hand secondary to fracture/wound h   Splinting   Splinting A custom splint was fabricated for pt's LUE palcing wrist in neutral, MCP's flexed and IP's extended. Pt was educated in pin/wound care, and signs/symptoms of possible infection. Hand outs were given for pin & splint wear and care and they were reviewed with pt in clinic as well. He verbalized understanding includingno functional use of his left hand/UE at this time.               OT Education - 03/25/15 0829    Education provided Yes   Education Details splint wear and care,  pin site care, precautions   Person(s) Educated Patient   Methods Explanation;Demonstration;Handout   Comprehension Verbalized understanding          OT Short Term Goals - 03/25/15 0910    OT SHORT TERM GOAL #1   Title STG's=LTG's 1) Pt will be I protective splint use, care and precautions and pin//wound care left hand    Baseline Verbal cues   Time 3   Period Weeks   Status New                  Plan - 03/25/15 0903    Clinical Impression Statement Pt is a pleasant 64 y/o male s/p I & D, ORIF w/ pinning Left long and ring fingers secondary to open metacarpal fractures on 03/13/15 (DOS: 03/14/15). Pt is currently 1 week and 4 days post-op. Per operative report, pt had abraided extensor tendons to his long and ring fingers but they were intact. He was fitted with a custom fabricated splint for protection and should benefit from 1-3 visits total for splint check and adjustements PRN. He will follow up with Dr Merlyn Lot on Friday, 03/28/15 per pt report for progression of program.   Pt will benefit from skilled therapeutic intervention in order to improve on the following deficits (Retired) Increased edema;Decreased skin integrity;Decreased knowledge of use of DME;Decreased coordination;Decreased range of motion;Pain;Impaired UE functional use   Rehab Potential Good   OT Frequency Other (comment)  Eval + 1-3 visits for splint check/adjustments over 6-8 week period   OT Treatment/Interventions Splinting;Patient/family education   Plan Pt to f/u for up to 1-3 visits over 6-8 week period for splint check and adjustements PRN as well as pt education. Pt's MD will progress program   Recommended Other Services F/U w/ MD for progression of program    Consulted and Agree with Plan of Care Patient        Problem List There are no active problems to display for this patient.   Mariam Dollar Beth Dixon, OTR/L 03/25/2015, 9:24 AM  [place] [facility] [address] [place], [place], [address] Phone: [phone]   Fax:  [phone]

## 2015-03-25 NOTE — Patient Instructions (Addendum)
WEARING SCHEDULE:  Wear splint at ALL times except for hygiene care   PURPOSE:  To prevent movement and for protection until injury can heal  CARE OF SPLINT:  Keep splint away from heat sources including: stove, radiator or furnace, or a car in sunlight. The splint can melt and will no longer fit you properly  Keep away from pets and children  Clean the splint with rubbing alcohol 1-2 times per day. Clean the base of the pins with hydrogen peroxide using Q-tips 2x/day * During this time, make sure you also clean your hand/arm as instructed by your therapist and/or perform dressing changes as needed. Then dry hand/arm completely before replacing splint. (When cleaning hand/arm, keep it immobilized in same position until splint is replaced)  PRECAUTIONS/POTENTIAL PROBLEMS: *If you notice or experience increased pain, swelling, numbness, or a lingering reddened area from the splint: Contact your therapist immediately by calling [phone]. You must wear the splint for protection, but we will get you scheduled for adjustments as quickly as possible.  (If only straps or hooks need to be replaced and NO adjustments to the splint need to be made, just call the office ahead and let them know you are coming in)  If you have any medical concerns or signs of infection, please call your doctor immediately

## 2015-03-28 ENCOUNTER — Ambulatory Visit: Admitting: Occupational Therapy

## 2015-03-28 DIAGNOSIS — S6292XB Unspecified fracture of left wrist and hand, initial encounter for open fracture: Secondary | ICD-10-CM | POA: Diagnosis not present

## 2015-03-28 NOTE — Therapy (Signed)
[facility] [address] [place], [place], [address] Phone: [phone]   Fax:  [phone]  Occupational Therapy Treatment  Patient Details  Name: Derrek GuLawrence D Cropp MRN: [id number] Date of Birth: 06-Jul-1950 No Data Recorded  Encounter Date: 03/28/2015      OT End of Session - 03/30/15 1701    Visit Number 2   Number of Visits 4   Authorization Type self pay   OT Start Time 0850   OT Stop Time 0930   OT Time Calculation (min) 40 min   Activity Tolerance Patient tolerated treatment well   Behavior [facility] for tasks assessed/performed      No past medical history on file.  Past Surgical History  Procedure Laterality Date  . I&d extremity Left 03/13/2015    Procedure: MINOR IRRIGATION AND DEBRIDEMENT EXTREMITY;  Surgeon: Betha LoaKevin Kuzma, MD;  Location: [facility] OR;  Service: Orthopedics;  Laterality: Left;  . Open reduction internal fixation (orif) hand Left 03/13/2015    Procedure: OPEN REDUCTION INTERNAL FIXATION (ORIF) HAND;  Surgeon: Betha LoaKevin Kuzma, MD;  Location: [facility] OR;  Service: Orthopedics;  Laterality: Left;    There were no vitals filed for this visit.  Visit Diagnosis:  Left hand fracture, open, initial encounter      Subjective Assessment - 03/30/15 1657    Subjective  Pt reports splint feels good   Pertinent History Pt reports non-significant PMH   Patient Stated Goals Get left hand use back.           Treatment: Pt arrived in protective splint. Splint check performed, and splint is fitting well without issues. Pt's hand was unwrapped and arm/ hand was cleaned with soap and water. Middle pin site demonstrates drainage that may indicate infection. Therapist contacted Dr. Merrilee SeashoreKuzma's office to make him aware as he will see pt today.  Pt's pinsites were cleaned with saline. Pt's open wound was dressed with xeroform and then hand was loosely wrapped with gauze topped with stockinette over pins. Therapist  reviewed splint wear/ care, hand hygiene and pin site care with pt. He verbalized understanding of all education.                      OT Short Term Goals - 03/25/15 0910    OT SHORT TERM GOAL #1   Title STG's=LTG's 1) Pt will be I protective splint use, care and precautions and pin//wound care left hand    Baseline Verbal cues   Time 3   Period Weeks   Status New                  Plan - 03/30/15 1657    Clinical Impression Statement Pt arrived wearing splint today. Pt reports he has cleaned pins 1x since splint was made. Pt demonstrates drainage aound middle pin site that may i9ndicate infection . MD office contacted and pt has an appointment  today.   Pt will benefit from skilled therapeutic intervention in order to improve on the following deficits (Retired) Increased edema;Decreased skin integrity;Decreased knowledge of use of DME;Decreased coordination;Decreased range of motion;Pain;Impaired UE functional use   OT Frequency Other (comment)  3 visits plus eval   OT Duration 8 weeks   OT Treatment/Interventions Splinting;Patient/family education   Plan Splint check/ modifications PRN   Consulted and Agree with Plan of Care Patient        Problem List There are no active problems to display for this patient.  RINE,KATHRYN 03/30/2015, 5:02 PM Keene Breath, OTR/L [phone] Phone: [phone] 5:07 PM 03/30/2015 [facility] [address] [place], [place], [address] Phone: [phone]   Fax:  [phone]  Name: JACQUESE HACKMAN MRN: [id number] Date of Birth: 28-Dec-1950

## 2015-03-28 NOTE — Patient Instructions (Signed)
Change dressing daily, clean pin sites 1-2 x day with peroxide. Wash arm with soap and water 1x day and make sure arm is fully dry before putting splint back on. Look out for whitish areas on your skin. This is a sign that your skin is staying wet and needs to be dried more often. Apply xeroform dressing only to open wound until it dries out. If signs of infection- drainage , redness, call Dr Merlyn LotKuzma, if problems with splint call [phone]( [phone])

## 2015-04-08 ENCOUNTER — Ambulatory Visit: Admitting: Occupational Therapy

## 2016-07-29 ENCOUNTER — Encounter (HOSPITAL_COMMUNITY): Payer: Self-pay

## 2016-07-29 ENCOUNTER — Emergency Department (HOSPITAL_COMMUNITY)
Admission: EM | Admit: 2016-07-29 | Discharge: 2016-07-29 | Disposition: A | Discharge status: Home / Self Care | Attending: Emergency Medicine | Admitting: Emergency Medicine

## 2016-07-29 DIAGNOSIS — M5441 Lumbago with sciatica, right side: Secondary | ICD-10-CM | POA: Insufficient documentation

## 2016-07-29 DIAGNOSIS — Z87891 Personal history of nicotine dependence: Secondary | ICD-10-CM | POA: Diagnosis not present

## 2016-07-29 DIAGNOSIS — Z79899 Other long term (current) drug therapy: Secondary | ICD-10-CM | POA: Insufficient documentation

## 2016-07-29 DIAGNOSIS — R300 Dysuria: Secondary | ICD-10-CM | POA: Diagnosis not present

## 2016-07-29 DIAGNOSIS — M545 Low back pain: Secondary | ICD-10-CM | POA: Diagnosis present

## 2016-07-29 DIAGNOSIS — M5431 Sciatica, right side: Secondary | ICD-10-CM

## 2016-07-29 LAB — URINALYSIS, ROUTINE W REFLEX MICROSCOPIC
Bilirubin Urine: NEGATIVE
GLUCOSE, UA: NEGATIVE mg/dL
HGB URINE DIPSTICK: NEGATIVE
KETONES UR: NEGATIVE mg/dL
Leukocytes, UA: NEGATIVE
Nitrite: NEGATIVE
PROTEIN: NEGATIVE mg/dL
Specific Gravity, Urine: 1.019 (ref 1.005–1.030)
pH: 5 (ref 5.0–8.0)

## 2016-07-29 MED ORDER — METHOCARBAMOL 500 MG PO TABS: 500.0000 mg | ORAL_TABLET | Freq: Two times a day (BID) | ORAL | 0 refills | Status: AC

## 2016-07-29 MED ORDER — NAPROXEN 500 MG PO TABS: 500.0000 mg | ORAL_TABLET | Freq: Two times a day (BID) | ORAL | 0 refills | Status: AC

## 2016-07-29 MED ORDER — NAPROXEN 250 MG PO TABS
500.0000 mg | ORAL_TABLET | Freq: Once | ORAL | Status: AC
  Administered 2016-07-29: 500 mg via ORAL
  Filled 2016-07-29: qty 2

## 2016-07-29 MED ORDER — DIAZEPAM 5 MG PO TABS
5.0000 mg | ORAL_TABLET | Freq: Once | ORAL | Status: AC
Start: 2016-07-29 — End: 2016-07-29
  Administered 2016-07-29: 5 mg via ORAL
  Filled 2016-07-29: qty 1

## 2016-07-29 MED ORDER — HYDROCODONE-ACETAMINOPHEN 5-325 MG PO TABS
1.0000 | ORAL_TABLET | Freq: Once | ORAL | Status: AC
  Administered 2016-07-29: 1 via ORAL
  Filled 2016-07-29: qty 1

## 2016-07-29 NOTE — ED Provider Notes (Signed)
[facility]-EMERG[facility]Y DEPT Provider Note   CSN: [id number] Arrival date & time: 07/29/16  1626   By signing my name below, I, Freida Busmaniana Omoyeni, attest that this documentation has been prepared under the direction and in the presence of Derwood KaplanAnkit Chatham Howington, MD . Electronically Signed: Freida Busmaniana Omoyeni, Scribe. 07/29/2016. 6:29 PM.  History   Chief Complaint Chief Complaint  Patient presents with  . Back Pain    The history is provided by the patient. No language interpreter was used.    HPI Comments:  Adam Gentry is a 66 y.o. male who presents to the Emergency Department complaining of moderate mid lower back pain x a few days. He notes radiation of pain down the RLE with certain movements. Pt has no associated numbness, weakness, urinary incontinence, urinary retention, bowel incontinence, pins and needle sensation in the perineal area. No associated night sweats, significant weight loss recently. He denies h/o sciatica. Pt has associated mild discomfort with urination and urinary frequency. He denies h/o kidney stones. Pt has a h/o UTI with similar symptoms. Pt states he is not at risk for STDs; denies unprotected sex and sex with multiple partners. No scrotal trauma or rash to scrotal region.  No alleviating factors noted.    History reviewed. No pertinent past medical history.  There are no active problems to display for this patient.   Past Surgical History:  Procedure Laterality Date  . I&D EXTREMITY Left 03/13/2015   Procedure: MINOR IRRIGATION AND DEBRIDEMENT EXTREMITY;  Surgeon: Betha LoaKevin Kuzma, MD;  Location: [facility] OR;  Service: Orthopedics;  Laterality: Left;  . OPEN REDUCTION INTERNAL FIXATION (ORIF) HAND Left 03/13/2015   Procedure: OPEN REDUCTION INTERNAL FIXATION (ORIF) HAND;  Surgeon: Betha LoaKevin Kuzma, MD;  Location: [facility] OR;  Service: Orthopedics;  Laterality: Left;       Home Medications    Prior to Admission medications   Medication Sig Start Date End Date Taking? Authorizing Provider   methocarbamol (ROBAXIN) 500 MG tablet Take 1 tablet (500 mg total) by mouth 2 (two) times daily. 07/29/16   Derwood KaplanAnkit Hays Dunnigan, MD  naproxen (NAPROSYN) 500 MG tablet Take 1 tablet (500 mg total) by mouth 2 (two) times daily. 07/29/16   Derwood KaplanAnkit Taggert Bozzi, MD  oxyCODONE-acetaminophen (PERCOCET) 5-325 MG tablet 1-2 tabs po q6 hours prn pain 03/14/15   Betha LoaKevin Kuzma, MD  sulfamethoxazole-trimethoprim (BACTRIM DS) 800-160 MG tablet Take 1 tablet by mouth 2 (two) times daily. 03/14/15   Betha LoaKevin Kuzma, MD    Family History History reviewed. No pertinent family history.  Social History Social History  Substance Use Topics  . Smoking status: Former Smoker    Types: Cigarettes    Quit date: 05/28/2016  . Smokeless tobacco: Never Used  . Alcohol use Yes     Comment: occasional     Allergies   Patient has no known allergies.   Review of Systems Review of Systems  Genitourinary: Positive for dysuria and frequency. Negative for testicular pain.  Musculoskeletal: Positive for back pain.  Skin: Negative for rash.     Physical Exam Updated Vital Signs BP 169/93 (BP Location: Right Arm)   Pulse 81   Temp 98.3 F (36.8 C) (Oral)   Ht 6' (1.829 m)   Wt 171 lb (77.6 kg)   SpO2 100%   BMI 23.19 kg/m   Physical Exam  Constitutional: He is oriented to person, place, and time. He appears well-developed and well-nourished. No distress.  HENT:  Head: Normocephalic and atraumatic.  Eyes: Conjunctivae are normal.  Cardiovascular: Normal  rate.   Pulmonary/Chest: Effort normal.  Abdominal: He exhibits no distension.  Musculoskeletal:  Right passive leg raise is positive  No midline lumbar or sacral tenderness   Neurological: He is alert and oriented to person, place, and time.  Skin: Skin is warm and dry.  Psychiatric: He has a normal mood and affect.  Nursing note and vitals reviewed.    ED Treatments / Results  DIAGNOSTIC STUDIES:  Oxygen Saturation is 100% on RA, normal by my interpretation.     COORDINATION OF CARE:  6:27 PM Discussed treatment plan with pt at bedside and pt agreed to plan.  Labs (all labs ordered are listed, but only abnormal results are displayed) Labs Reviewed  URINALYSIS, ROUTINE W REFLEX MICROSCOPIC    EKG  EKG Interpretation None       Radiology No results found.  Procedures Procedures (including critical care time)  Medications Ordered in ED Medications  naproxen (NAPROSYN) tablet 500 mg (500 mg Oral Given 07/29/16 1856)  HYDROcodone-acetaminophen (NORCO/VICODIN) 5-325 MG per tablet 1 tablet (1 tablet Oral Given 07/29/16 1856)  diazepam (VALIUM) tablet 5 mg (5 mg Oral Given 07/29/16 1857)     Initial Impression / Assessment and Plan / ED Course  I have reviewed the triage vital signs and the nursing notes.  Pertinent labs & imaging results that were available during my care of the patient were reviewed by me and considered in my medical decision making (see chart for details).    I personally performed the services described in this documentation, which was scribed in my presence. The recorded information has been reviewed and is accurate.  Pt comes in with cc of back pain. Pain really is not over the spine, but more towards the hip region and there is + passive straight leg raise. As such, clinically pt has sciatica. Pt has no medical hx. Pt has no associated numbness, weakness, urinary incontinence, urinary retention, bowel incontinence, pins and needle sensation in the perineal area. Pt has no constitutionals concerning for cancer. We will therefore not get any imaging in the ER - but ask pt to see pcp if the pain is not improving. Pt is in agreement with the plan.  Pt is having intermittent urinary discomfort. No penile discharge, rash. No risk for STDS.  We will check the UA. If clean - we will advise pcp f/u, as it doesn't sound like this is classic UTI.     Final Clinical Impressions(s) / ED Diagnoses   Final diagnoses:    Sciatica of right side  Dysuria    New Prescriptions New Prescriptions   METHOCARBAMOL (ROBAXIN) 500 MG TABLET    Take 1 tablet (500 mg total) by mouth 2 (two) times daily.   NAPROXEN (NAPROSYN) 500 MG TABLET    Take 1 tablet (500 mg total) by mouth 2 (two) times daily.     Derwood Kaplan, MD 07/29/16 2006

## 2016-07-29 NOTE — Discharge Instructions (Signed)
Urine exam is normal. We are not sure what is causing your symptoms. The workup in the ER is not complete, and is limited to screening for life threatening and emergent conditions only, so please see a primary care doctor for further evaluation.  The back pain appears to be sciatica pain. We will start you on conservative meds for treatment. Back pain over the age of 64 needs further evaluation of primary doctor to ensure there is no other concerning cause, including cancer - so please see them for the back pain as well.

## 2016-07-29 NOTE — ED Triage Notes (Signed)
Pt from home endorsing lower back pain radiating down the right leg that began after a fall 1 week ago. Pt ambulatory and did not hit head. VSS.

## 2018-06-18 ENCOUNTER — Encounter (HOSPITAL_COMMUNITY): Payer: Self-pay | Admitting: Radiology

## 2018-06-18 ENCOUNTER — Emergency Department (HOSPITAL_COMMUNITY): Payer: Medicare Other

## 2018-06-18 ENCOUNTER — Emergency Department (HOSPITAL_COMMUNITY)
Admission: EM | Admit: 2018-06-18 | Discharge: 2018-06-18 | Disposition: A | Discharge status: Home / Self Care | Payer: Medicare Other | Attending: Emergency Medicine | Admitting: Emergency Medicine

## 2018-06-18 DIAGNOSIS — Z87891 Personal history of nicotine dependence: Secondary | ICD-10-CM | POA: Diagnosis not present

## 2018-06-18 DIAGNOSIS — L03213 Periorbital cellulitis: Secondary | ICD-10-CM | POA: Insufficient documentation

## 2018-06-18 DIAGNOSIS — H5712 Ocular pain, left eye: Secondary | ICD-10-CM | POA: Diagnosis present

## 2018-06-18 DIAGNOSIS — R030 Elevated blood-pressure reading, without diagnosis of hypertension: Secondary | ICD-10-CM

## 2018-06-18 DIAGNOSIS — Z79899 Other long term (current) drug therapy: Secondary | ICD-10-CM | POA: Diagnosis not present

## 2018-06-18 LAB — CBC WITH DIFFERENTIAL/PLATELET
Abs Immature Granulocytes: 0.03 10*3/uL (ref 0.00–0.07)
Basophils Absolute: 0.1 10*3/uL (ref 0.0–0.1)
Basophils Relative: 1 %
EOS PCT: 2 %
Eosinophils Absolute: 0.2 10*3/uL (ref 0.0–0.5)
HEMATOCRIT: 51.5 % (ref 39.0–52.0)
HEMOGLOBIN: 16.9 g/dL (ref 13.0–17.0)
Immature Granulocytes: 0 %
LYMPHS PCT: 12 %
Lymphs Abs: 1.3 10*3/uL (ref 0.7–4.0)
MCH: 30.2 pg (ref 26.0–34.0)
MCHC: 32.8 g/dL (ref 30.0–36.0)
MCV: 92.1 fL (ref 80.0–100.0)
MONO ABS: 0.7 10*3/uL (ref 0.1–1.0)
Monocytes Relative: 6 %
Neutro Abs: 8.3 10*3/uL — ABNORMAL HIGH (ref 1.7–7.7)
Neutrophils Relative %: 79 %
Platelets: 296 10*3/uL (ref 150–400)
RBC: 5.59 MIL/uL (ref 4.22–5.81)
RDW: 12.6 % (ref 11.5–15.5)
WBC: 10.6 10*3/uL — AB (ref 4.0–10.5)
nRBC: 0 % (ref 0.0–0.2)

## 2018-06-18 LAB — BASIC METABOLIC PANEL
ANION GAP: 8 (ref 5–15)
BUN: 17 mg/dL (ref 8–23)
CO2: 26 mmol/L (ref 22–32)
Calcium: 9 mg/dL (ref 8.9–10.3)
Chloride: 105 mmol/L (ref 98–111)
Creatinine, Ser: 0.94 mg/dL (ref 0.61–1.24)
GFR calc Af Amer: >60 mL/min (ref >60)
GFR calc non Af Amer: >60 mL/min (ref >60)
GLUCOSE: 106 mg/dL — AB (ref 70–99)
POTASSIUM: 3.8 mmol/L (ref 3.5–5.1)
Sodium: 139 mmol/L (ref 135–145)

## 2018-06-18 MED ORDER — ACETAMINOPHEN 325 MG PO TABS
650.0000 mg | ORAL_TABLET | Freq: Once | ORAL | Status: AC
  Administered 2018-06-18: 650 mg via ORAL
  Filled 2018-06-18: qty 2

## 2018-06-18 MED ORDER — CLINDAMYCIN HCL 150 MG PO CAPS
300.0000 mg | ORAL_CAPSULE | Freq: Once | ORAL | Status: AC
  Administered 2018-06-18: 300 mg via ORAL
  Filled 2018-06-18: qty 2

## 2018-06-18 MED ORDER — TETRACAINE HCL 0.5 % OP SOLN
1.0000 [drp] | Freq: Once | OPHTHALMIC | Status: AC
  Administered 2018-06-18: 1 [drp] via OPHTHALMIC
  Filled 2018-06-18: qty 4

## 2018-06-18 MED ORDER — CEFDINIR 300 MG PO CAPS
300.0000 mg | ORAL_CAPSULE | Freq: Once | ORAL | Status: AC
  Administered 2018-06-18: 300 mg via ORAL
  Filled 2018-06-18: qty 1

## 2018-06-18 MED ORDER — IOHEXOL 300 MG/ML  SOLN
75.0000 mL | Freq: Once | INTRAMUSCULAR | Status: AC | PRN
  Administered 2018-06-18: 75 mL via INTRAVENOUS

## 2018-06-18 MED ORDER — CLINDAMYCIN HCL 300 MG PO CAPS: 300.0000 mg | ORAL_CAPSULE | Freq: Three times a day (TID) | ORAL | 0 refills | Status: AC

## 2018-06-18 MED ORDER — CEFDINIR 300 MG PO CAPS: 300.0000 mg | ORAL_CAPSULE | Freq: Two times a day (BID) | ORAL | 0 refills | Status: AC

## 2018-06-18 NOTE — ED Notes (Signed)
Patient verbalizes understanding of discharge instructions. Opportunity for questioning and answers were provided. Armband removed by staff, pt discharged from ED ambulatory.   

## 2018-06-18 NOTE — ED Triage Notes (Signed)
Pt in c/o L eye pain after finishing a full rx of Amoxicillin x 3 days ago for teeth problem, pt has appt  Dentist 08/27/18, pt has swelling present to L face and L eyelid, A&O x4

## 2018-06-18 NOTE — ED Provider Notes (Signed)
[facility] EMERGENCY DEPARTMENT Provider Note   CSN: [id number] Arrival date & time: 06/18/18  0505     History   Chief Complaint Chief Complaint  Patient presents with  . Eye Pain    HPI Adam Gentry is a 68 y.o. male.  HPI  Patient is a 68 yo male with no significant past medical history presenting for left eye pain and swelling.  He reports that this morning his left eye was "swollen shut".  Patient reports that he recently finished a course of amoxicillin for left upper dental pain and feels that the infection is "spreading".  He reports that his left upper dental pain will intermittently hurt, however does not painful at present.  Denies any difficulty breathing, difficulty swelling, neck induration, or submandibular tenderness.  He denies any pain with extraocular motions, or feeling he has restricted movement of his eye.  Patient denies any.  Purulent drainage from the eye.  He denies any pain on the surface of the eye itself.  He denies any fever or chills.  Reports that he has had amoxicillin before without issue.   No past medical history on file.  There are no active problems to display for this patient.   Past Surgical History:  Procedure Laterality Date  . I&D EXTREMITY Left 03/13/2015   Procedure: MINOR IRRIGATION AND DEBRIDEMENT EXTREMITY;  Surgeon: Betha Loa, MD;  Location: [facility] OR;  Service: Orthopedics;  Laterality: Left;  . OPEN REDUCTION INTERNAL FIXATION (ORIF) HAND Left 03/13/2015   Procedure: OPEN REDUCTION INTERNAL FIXATION (ORIF) HAND;  Surgeon: Betha Loa, MD;  Location: [facility] OR;  Service: Orthopedics;  Laterality: Left;        Home Medications    Prior to Admission medications   Medication Sig Start Date End Date Taking? Authorizing Provider  methocarbamol (ROBAXIN) 500 MG tablet Take 1 tablet (500 mg total) by mouth 2 (two) times daily. 07/29/16   Derwood Kaplan, MD  naproxen (NAPROSYN) 500 MG tablet Take 1 tablet (500 mg  total) by mouth 2 (two) times daily. 07/29/16   Derwood Kaplan, MD  oxyCODONE-acetaminophen (PERCOCET) 5-325 MG tablet 1-2 tabs po q6 hours prn pain 03/14/15   Betha Loa, MD  sulfamethoxazole-trimethoprim (BACTRIM DS) 800-160 MG tablet Take 1 tablet by mouth 2 (two) times daily. 03/14/15   Betha Loa, MD    Family History No family history on file.  Social History Social History   Tobacco Use  . Smoking status: Former Smoker    Types: Cigarettes    Last attempt to quit: 05/28/2016    Years since quitting: 2.0  . Smokeless tobacco: Never Used  Substance Use Topics  . Alcohol use: Yes    Comment: occasional  . Drug use: No     Allergies   Patient has no known allergies.   Review of Systems Review of Systems  Constitutional: Negative for chills and fever.  HENT: Positive for sinus pressure. Negative for ear pain and facial swelling.   Eyes: Positive for pain and redness. Negative for discharge and itching.  Gastrointestinal: Negative for nausea and vomiting.       Physical Exam Updated Vital Signs BP (!) 169/99 (BP Location: Right Arm)   Pulse 84   Temp (!) 97.5 F (36.4 C) (Oral)   Resp 18   Ht 6' (1.829 m)   Wt 78 kg   SpO2 98%   BMI 23.33 kg/m   Physical Exam Vitals signs and nursing note reviewed.  Constitutional:  General: He is not in acute distress.    Appearance: He is well-developed. He is not diaphoretic.     Comments: Sitting comfortably in bed.  HENT:     Head: Normocephalic and atraumatic.  Eyes:     General:        Right eye: No discharge.        Left eye: No discharge.     Extraocular Movements: Extraocular movements intact.     Conjunctiva/sclera: Conjunctivae normal.     Pupils: Pupils are equal, round, and reactive to light.     Comments: Left eye exam: Patient has erythematous left upper eyelid (See clinical photo.)  No proptosis.  EOMs intact.  Tono-Pen pressures 12 on the left and 16 on the right.   Visual Acuity   Right  Eye Near: R Near: 10/16 Left Eye Near:  L Near: 10/16 Bilateral Near:  10/12.5(pt states he wears glasses does not have them here)   Neck:     Musculoskeletal: Normal range of motion and neck supple.  Cardiovascular:     Rate and Rhythm: Normal rate and regular rhythm.     Comments: Intact, 2+ radial pulse. Abdominal:     General: There is no distension.  Musculoskeletal: Normal range of motion.  Lymphadenopathy:     Cervical: No cervical adenopathy.  Skin:    General: Skin is warm and dry.  Neurological:     Mental Status: He is alert.     Comments: Cranial nerves intact to gross observation. Patient moves extremities without difficulty.  Psychiatric:        Behavior: Behavior normal.        Thought Content: Thought content normal.        Judgment: Judgment normal.      ED Treatments / Results  Labs (all labs ordered are listed, but only abnormal results are displayed) Labs Reviewed  BASIC METABOLIC PANEL - Abnormal; Notable for the following components:      Result Value   Glucose, Bld 106 (*)    All other components within normal limits  CBC WITH DIFFERENTIAL/PLATELET - Abnormal; Notable for the following components:   WBC 10.6 (*)    Neutro Abs 8.3 (*)    All other components within normal limits    EKG None  Radiology Ct Maxillofacial W Contrast  Result Date: 06/18/2018 CLINICAL DATA:  Acute presentation with left facial swelling. Dental disease treated with antibiotics. EXAM: CT MAXILLOFACIAL WITH CONTRAST TECHNIQUE: Multidetector CT imaging of the maxillofacial structures was performed with intravenous contrast. Multiplanar CT image reconstructions were also generated. CONTRAST:  75mL OMNIPAQUE IOHEXOL 300 MG/ML  SOLN COMPARISON:  None. FINDINGS: Osseous: No acute or traumatic bone finding. Patient has advanced dental and periodontal disease, most severe in the region of the roots of teeth 13, 19, and 29. Orbits: No postseptal orbital abnormality. There may be  some superficial periorbital soft tissue swelling left more than right. Sinuses: Mucosal inflammatory changes of the left maxillary sinus. Mild mucosal thickening of the right maxillary sinus floor. Mild mucosal thickening of the frontal and ethmoid regions. Soft tissues: Mild soft tissue swelling of the face left more than right consistent with superficial cellulitis. Limited intracranial: Negative IMPRESSION: 1. Advanced dental and periodontal disease, most severe in the region of the roots of teeth 13, 19, and 29. 2. Mild superficial periorbital soft tissue swelling left more than right consistent with superficial cellulitis. No evidence of postseptal orbital involvement. Electronically Signed   By: Scherrie BatemanMark  Shogry M.D.  On: 06/18/2018 10:42    Procedures Procedures (including critical care time)  Medications Ordered in ED Medications  tetracaine (PONTOCAINE) 0.5 % ophthalmic solution 1 drop (has no administration in time range)     Initial Impression / Assessment and Plan / ED Course  I have reviewed the triage vital signs and the nursing notes.  Pertinent labs & imaging results that were available during my care of the patient were reviewed by me and considered in my medical decision making (see chart for details).  Clinical Course as of Jun 18 1224  Wynelle LinkSun Jun 18, 2018  [id number] Case discussed with Dr. Gwyneth SproutWhitney Plunkett.  Recommend CT to assess for any extension of dental infection to orbit.   [AM]  [phone] Case discussed with CT technician who states that CT maxillofacial with contrast will be the best order to obtain all necessary images.    [AM]    Clinical Course User Index [AM] Elisha PonderMurray, Shalee Paolo B, PA-C   Patient is nontoxic-appearing, afebrile, and in no acute distress.  Frontal diagnosis includes preseptal cellulitis, placental cellulitis, extension dental infection..  Doubt preseptal cellulitis given patient's exam without proptosis, pain with EOM movements.   Patient has slight  leukocytosis of 10.6. Likely reactive.  CT scan maxillofacial demonstrates soft tissue swelling over the left eye consistent with preseptal cellulitis, but no evidence of preseptal cellulitis.  Case discussed with attending physician, Dr. Cristi LoronWhitney Puckett reviewed case and imaging as well as clinical images.  Patient to be treated for preseptal cellulitis with clindamycin and cefdinir.  Strict return precautions were given for any increasing swelling, redness, dental pain, fever or chills, difficulty breathing or swallowing.  Patient is in understanding and agrees with the plan of care.  Patient had multiple hypertensive readings in emergency department.  Discussed establishing primary care with the patient.  He states that he would like to reestablish care with his previous primary care provider.  I provided the phone number and instructed the patient to follow-up as soon as possible for a general health physical.  This is a supervised visit with Dr. Gwyneth SproutWhitney Plunkett. Evaluation, management, and discharge planning discussed with this attending physician.  Final Clinical Impressions(s) / ED Diagnoses   Final diagnoses:  Periorbital cellulitis of left eye  Elevated blood pressure reading without diagnosis of hypertension    ED Discharge Orders         Ordered    clindamycin (CLEOCIN) 300 MG capsule  3 times daily     06/18/18 1154    cefdinir (OMNICEF) 300 MG capsule  2 times daily     06/18/18 1154           Delia ChimesMurray, Emberly Tomasso B, PA-C 06/18/18 1230    Gwyneth SproutPlunkett, Whitney, MD 06/19/18 2229

## 2018-06-18 NOTE — Discharge Instructions (Signed)
Please see the information and instructions below regarding your visit.  Your diagnoses today include:  1. Periorbital cellulitis of left eye   2. Elevated blood pressure reading without diagnosis of hypertension     Tests performed today include: See side panel of your discharge paperwork for testing performed today. Vital signs are listed at the bottom of these instructions.   Medications prescribed:    Take any prescribed medications only as prescribed, and any over the counter medications only as directed on the packaging.  Please take all of your antibiotics until finished.   You may develop abdominal discomfort or nausea from the antibiotic. If this occurs, you may take it with food. Some patients also get diarrhea with antibiotics. You may help offset this with probiotics which you can buy or get in yogurt. Do not eat or take the probiotics until 2 hours after your antibiotic. Some women develop vaginal yeast infections after antibiotics. If you develop unusual vaginal discharge after being on this medication, please see your primary care provider.   I recommend over-the-counter probiotic such as Culturelle as well as yogurt.  Some people develop allergies to antibiotics. Symptoms of antibiotic allergy can be mild and include a flat rash and itching. They can also be more serious and include:  ?Hives - Hives are raised, red patches of skin that are usually very itchy.  ?Lip or tongue swelling  ?Trouble swallowing or breathing  ?Blistering of the skin or mouth.  If you have any of these serious symptoms, please seek emergency medical care immediately.   Home care instructions:  Please follow any educational materials contained in this packet.   Follow-up instructions: Please follow-up with your primary care provider as soon as possible for further evaluation of your symptoms if they are not completely improved.   Return instructions:  Please return to the Emergency  Department if you experience worsening symptoms.  Please return the emergency department if you have any increasing redness around her eye, drainage from the eye, or fever or chills. Return immediately for any swelling around her chest, difficulty breathing, difficulty swallowing or sore throat. Please return if you have any other emergent concerns.  Additional Information:   Your vital signs today were: BP (!) 166/104 (BP Location: Right Arm)    Pulse 67    Temp (!) 97.5 F (36.4 C) (Oral)    Resp 16    Ht 6' (1.829 m)    Wt 78 kg    SpO2 99%    BMI 23.33 kg/m  If your blood pressure (BP) was elevated on multiple readings during this visit above 130 for the top number or above 80 for the bottom number, please have this repeated by your primary care provider within one month. --------------  Thank you for allowing [place] to participate in your care today.

## 2020-08-07 IMAGING — CT CT MAXILLOFACIAL W/ CM
3 of 6 series · 16 of 47 positions shown, 19 images · IV contrast (APPLIED)
Comparison: None.

CLINICAL DATA: Acute presentation with left facial swelling. Dental
disease treated with antibiotics.

EXAM:
CT MAXILLOFACIAL WITH CONTRAST
TECHNIQUE: Multidetector CT imaging of the maxillofacial structures was
performed with intravenous contrast. Multiplanar CT image
reconstructions were also generated.
CONTRAST:  75mL OMNIPAQUE IOHEXOL 300 MG/ML  SOLN

[Series 3: maxilllofacial 2.0 hr40 3 · axial · 0.33mm/px · z∈[-188,-44]mm · 11 of 84 slices shown, 14 images]
[im 6/84  brain]
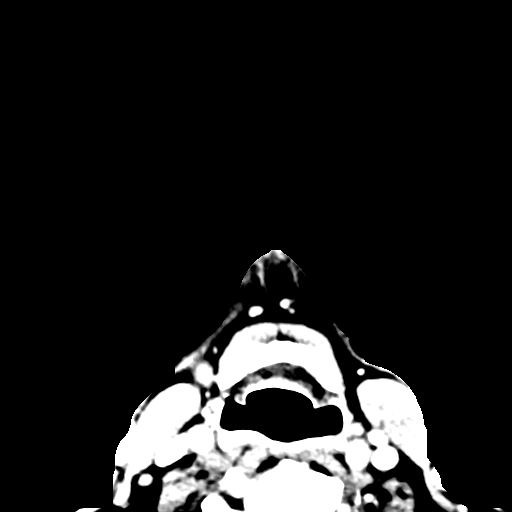
[im 6/84  bone]
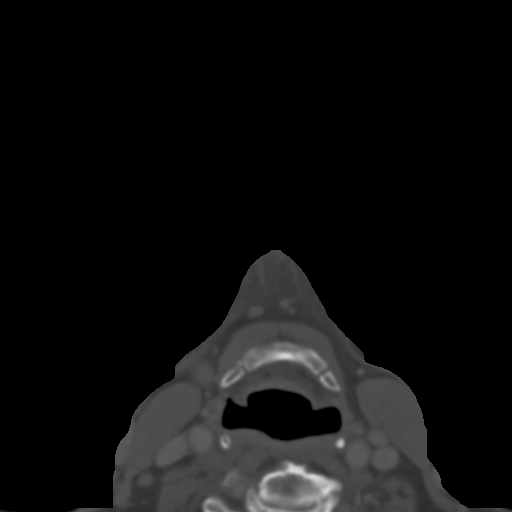
[im 12/84  bone]
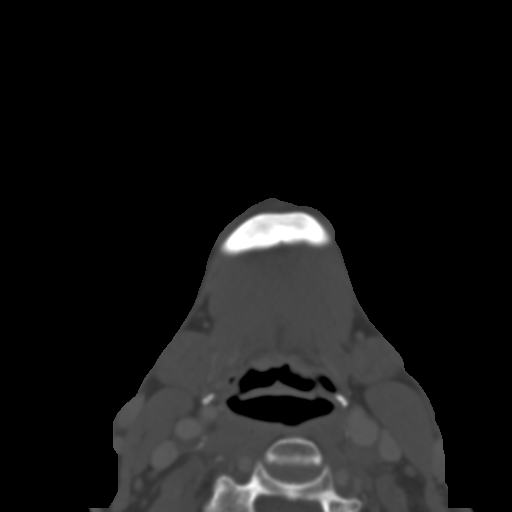
[im 18/84  bone]
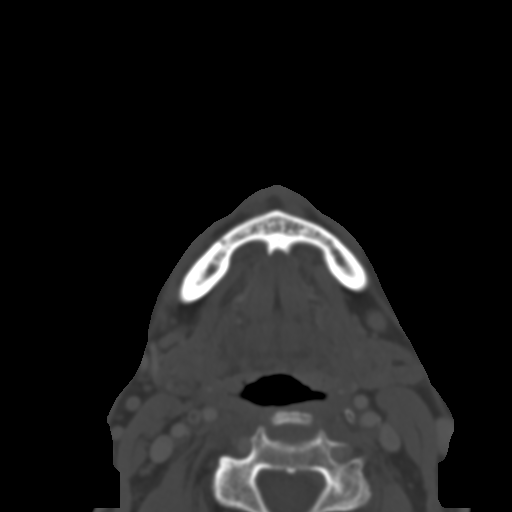
[im 30/84  bone]
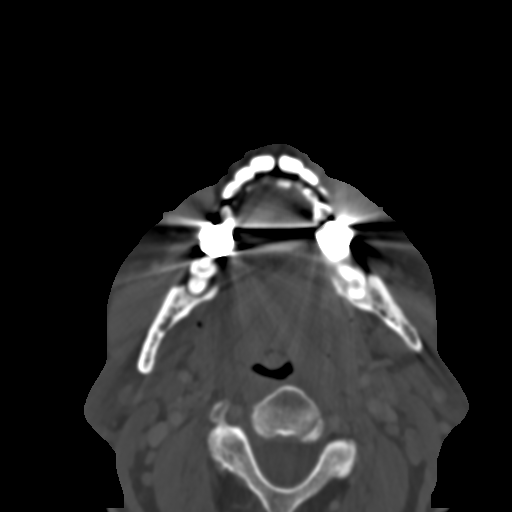
[im 36/84  brain]
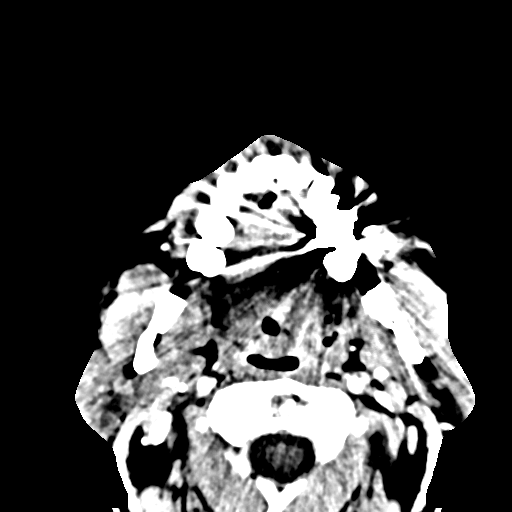
[im 36/84  bone]
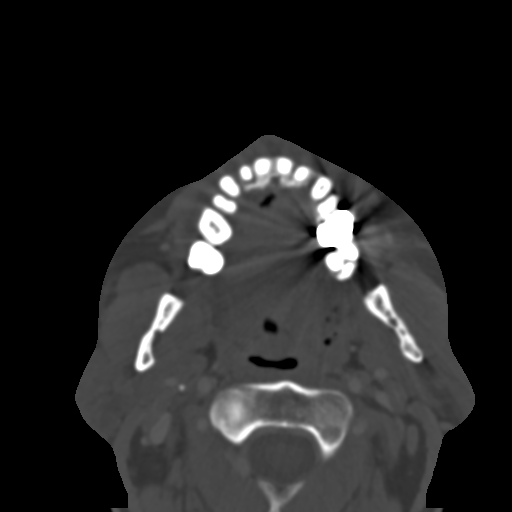
[im 42/84  bone]
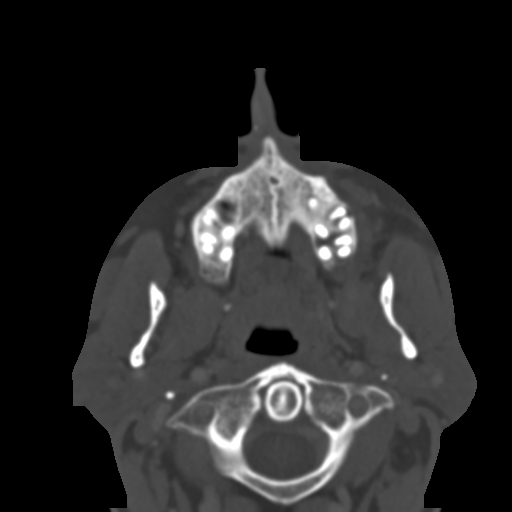
[im 48/84  bone]
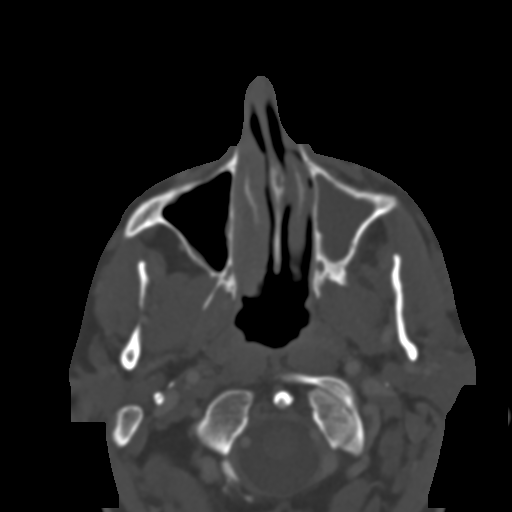
[im 54/84  bone]
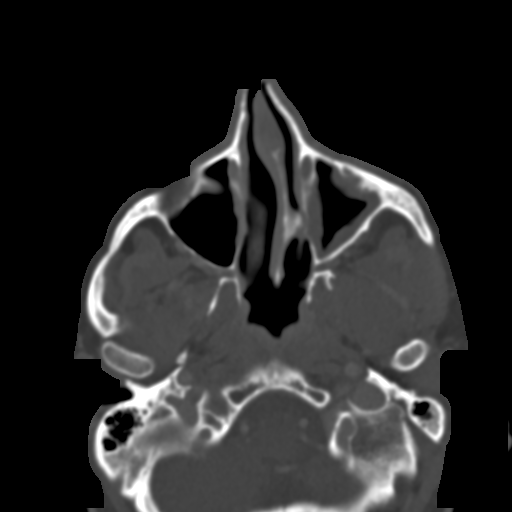
[im 66/84  brain]
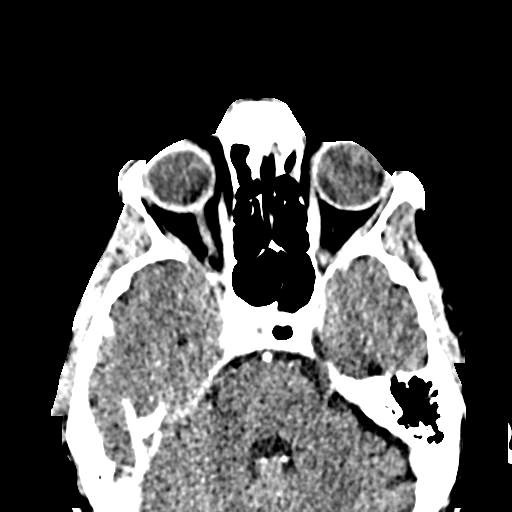
[im 66/84  bone]
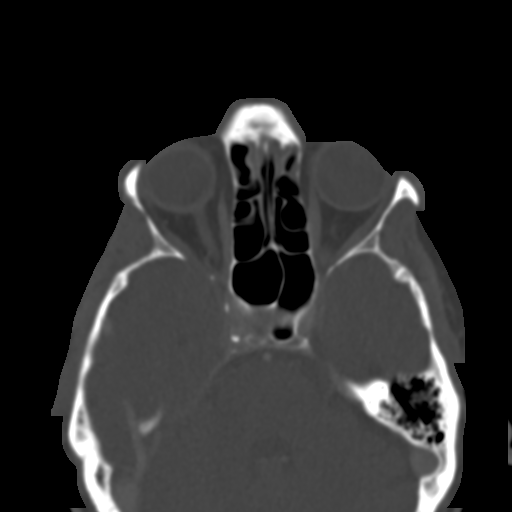
[im 72/84  bone]
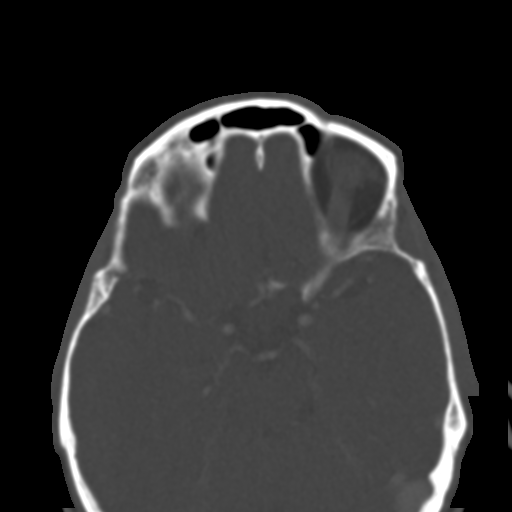
[im 78/84  bone]
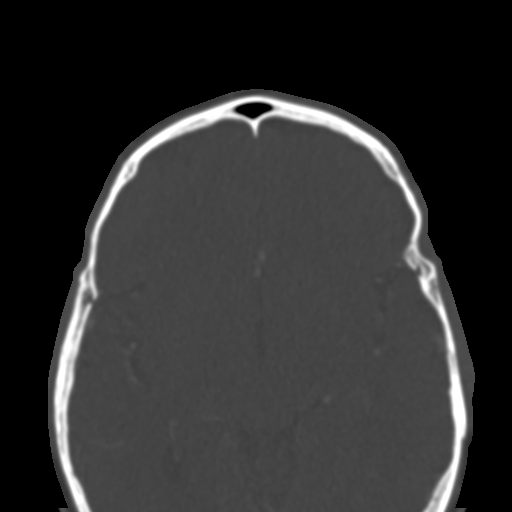

[Series 7: st cor · coronal · 0.35mm/px · 3 of 67 slices shown]
[im 17/67  bone]
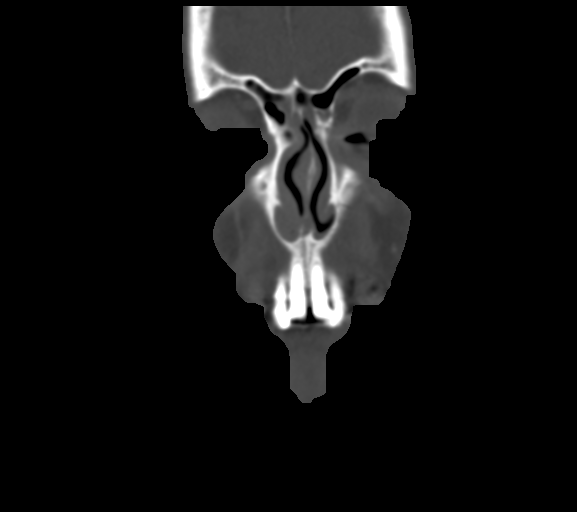
[im 34/67  bone]
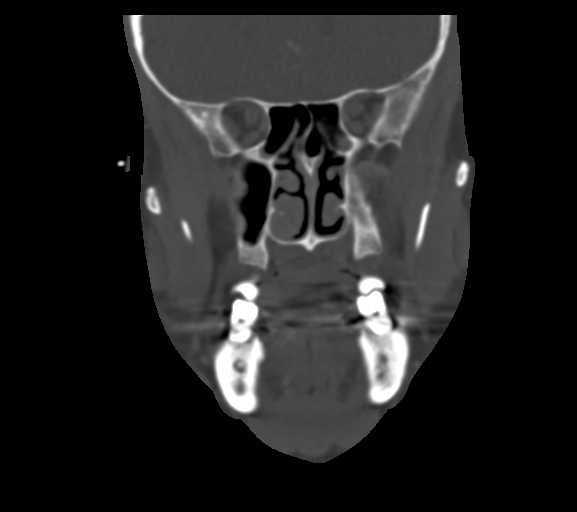
[im 50/67  bone]
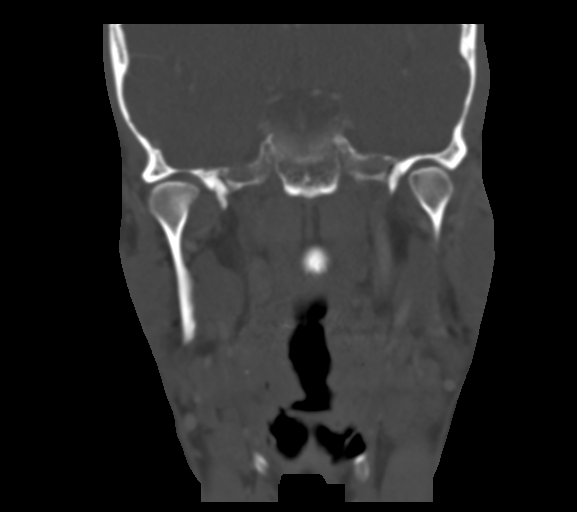

[Series 10: bone sag · sagittal · 0.37mm/px · 2 of 73 slices shown]
[im 25/73  bone]
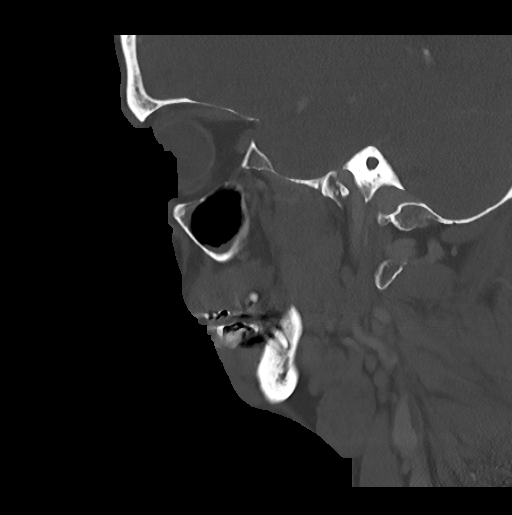
[im 49/73  bone]
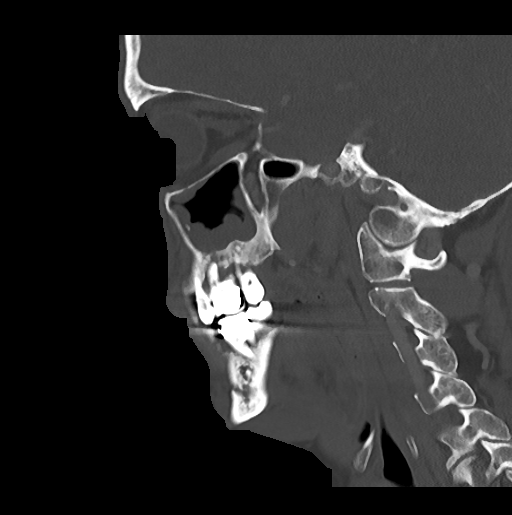

[16 of 47 positions shown; findings below may reference images not displayed]

FINDINGS: Osseous: No acute or traumatic bone finding. Patient has advanced
dental and periodontal disease, most severe in the region of the
roots of teeth 13, 19, and 29.

Orbits: No postseptal orbital abnormality. There may be some
superficial periorbital soft tissue swelling left more than right.

Sinuses: Mucosal inflammatory changes of the left maxillary sinus.
Mild mucosal thickening of the right maxillary sinus floor. Mild
mucosal thickening of the frontal and ethmoid regions.

Soft tissues: Mild soft tissue swelling of the face left more than
right consistent with superficial cellulitis.

Limited intracranial: Negative
IMPRESSION: 1. Advanced dental and periodontal disease, most severe in the
region of the roots of teeth 13, 19, and 29.
2. Mild superficial periorbital soft tissue swelling left more than
right consistent with superficial cellulitis. No evidence of
postseptal orbital involvement.

## 2022-07-16 DIAGNOSIS — H2513 Age-related nuclear cataract, bilateral: Secondary | ICD-10-CM | POA: Diagnosis not present

## 2024-04-23 DIAGNOSIS — G9341 Metabolic encephalopathy: Secondary | ICD-10-CM | POA: Diagnosis not present

## 2024-04-23 DIAGNOSIS — R579 Shock, unspecified: Secondary | ICD-10-CM | POA: Diagnosis not present

## 2024-04-23 DIAGNOSIS — T796XXA Traumatic ischemia of muscle, initial encounter: Secondary | ICD-10-CM | POA: Diagnosis not present

## 2024-04-23 DIAGNOSIS — I4891 Unspecified atrial fibrillation: Secondary | ICD-10-CM | POA: Diagnosis not present

## 2024-04-23 DIAGNOSIS — I2699 Other pulmonary embolism without acute cor pulmonale: Secondary | ICD-10-CM | POA: Diagnosis not present

## 2024-04-23 DIAGNOSIS — I444 Left anterior fascicular block: Secondary | ICD-10-CM | POA: Diagnosis not present

## 2024-04-23 DIAGNOSIS — A4159 Other Gram-negative sepsis: Secondary | ICD-10-CM | POA: Diagnosis not present

## 2024-04-23 DIAGNOSIS — J9601 Acute respiratory failure with hypoxia: Secondary | ICD-10-CM | POA: Diagnosis not present

## 2024-04-23 DIAGNOSIS — I4510 Unspecified right bundle-branch block: Secondary | ICD-10-CM | POA: Diagnosis not present

## 2024-04-23 DIAGNOSIS — I82413 Acute embolism and thrombosis of femoral vein, bilateral: Secondary | ICD-10-CM | POA: Diagnosis not present

## 2024-04-23 DIAGNOSIS — R9401 Abnormal electroencephalogram [EEG]: Secondary | ICD-10-CM | POA: Diagnosis not present

## 2024-04-23 DIAGNOSIS — I82433 Acute embolism and thrombosis of popliteal vein, bilateral: Secondary | ICD-10-CM | POA: Diagnosis not present

## 2024-04-23 DIAGNOSIS — I619 Nontraumatic intracerebral hemorrhage, unspecified: Secondary | ICD-10-CM | POA: Diagnosis not present

## 2024-04-23 DIAGNOSIS — R570 Cardiogenic shock: Secondary | ICD-10-CM | POA: Diagnosis not present

## 2024-04-23 DIAGNOSIS — Z66 Do not resuscitate: Secondary | ICD-10-CM | POA: Diagnosis not present

## 2024-04-23 DIAGNOSIS — Z9911 Dependence on respirator [ventilator] status: Secondary | ICD-10-CM | POA: Diagnosis not present

## 2024-04-23 DIAGNOSIS — Z515 Encounter for palliative care: Secondary | ICD-10-CM | POA: Diagnosis not present

## 2024-04-23 DIAGNOSIS — R55 Syncope and collapse: Secondary | ICD-10-CM | POA: Diagnosis not present

## 2024-04-23 DIAGNOSIS — I493 Ventricular premature depolarization: Secondary | ICD-10-CM | POA: Diagnosis not present

## 2024-04-23 DIAGNOSIS — I469 Cardiac arrest, cause unspecified: Secondary | ICD-10-CM | POA: Diagnosis not present

## 2024-04-23 DIAGNOSIS — N179 Acute kidney failure, unspecified: Secondary | ICD-10-CM | POA: Diagnosis not present

## 2024-04-23 DIAGNOSIS — K7200 Acute and subacute hepatic failure without coma: Secondary | ICD-10-CM | POA: Diagnosis not present

## 2024-04-23 DIAGNOSIS — G936 Cerebral edema: Secondary | ICD-10-CM | POA: Diagnosis not present

## 2024-04-23 DIAGNOSIS — J9602 Acute respiratory failure with hypercapnia: Secondary | ICD-10-CM | POA: Diagnosis not present

## 2024-04-23 DIAGNOSIS — I610 Nontraumatic intracerebral hemorrhage in hemisphere, subcortical: Secondary | ICD-10-CM | POA: Diagnosis not present

## 2024-04-23 DIAGNOSIS — R29703 NIHSS score 3: Secondary | ICD-10-CM | POA: Diagnosis not present

## 2024-05-14 DEATH — deceased
